# Patient Record
Sex: Female | Born: 1975 | Race: White | Hispanic: No | Marital: Married | Smoking: Current every day smoker
Health system: Southern US, Community
[De-identification: ages and names within clinical notes are randomized; demographics above are authoritative.]

## PROBLEM LIST (undated history)

## (undated) DIAGNOSIS — N200 Calculus of kidney: Secondary | ICD-10-CM

---

## 2001-02-07 ENCOUNTER — Other Ambulatory Visit: Admission: RE | Admit: 2001-02-07 | Discharge: 2001-02-07 | Payer: Self-pay | Admitting: *Deleted

## 2002-02-17 ENCOUNTER — Other Ambulatory Visit: Admission: RE | Admit: 2002-02-17 | Discharge: 2002-02-17 | Payer: Self-pay | Admitting: *Deleted

## 2002-08-02 ENCOUNTER — Inpatient Hospital Stay (HOSPITAL_COMMUNITY): Admission: AD | Admit: 2002-08-02 | Discharge: 2002-08-02 | Payer: Self-pay | Admitting: *Deleted

## 2002-09-05 ENCOUNTER — Inpatient Hospital Stay (HOSPITAL_COMMUNITY): Admission: AD | Admit: 2002-09-05 | Discharge: 2002-09-08 | Payer: Self-pay | Admitting: *Deleted

## 2002-10-13 ENCOUNTER — Other Ambulatory Visit: Admission: RE | Admit: 2002-10-13 | Discharge: 2002-10-13 | Payer: Self-pay | Admitting: *Deleted

## 2004-02-01 ENCOUNTER — Other Ambulatory Visit: Admission: RE | Admit: 2004-02-01 | Discharge: 2004-02-01 | Payer: Self-pay | Admitting: *Deleted

## 2005-01-25 ENCOUNTER — Other Ambulatory Visit: Admission: RE | Admit: 2005-01-25 | Discharge: 2005-01-25 | Payer: Self-pay | Admitting: Obstetrics and Gynecology

## 2005-08-04 ENCOUNTER — Inpatient Hospital Stay (HOSPITAL_COMMUNITY): Admission: AD | Admit: 2005-08-04 | Discharge: 2005-08-06 | Payer: Self-pay | Admitting: Obstetrics and Gynecology

## 2005-09-26 ENCOUNTER — Other Ambulatory Visit: Admission: RE | Admit: 2005-09-26 | Discharge: 2005-09-26 | Payer: Self-pay | Admitting: Obstetrics and Gynecology

## 2016-03-14 DIAGNOSIS — J0100 Acute maxillary sinusitis, unspecified: Secondary | ICD-10-CM | POA: Diagnosis not present

## 2016-05-25 DIAGNOSIS — Z01419 Encounter for gynecological examination (general) (routine) without abnormal findings: Secondary | ICD-10-CM | POA: Diagnosis not present

## 2016-05-25 DIAGNOSIS — Z6821 Body mass index (BMI) 21.0-21.9, adult: Secondary | ICD-10-CM | POA: Diagnosis not present

## 2016-05-25 DIAGNOSIS — N760 Acute vaginitis: Secondary | ICD-10-CM | POA: Diagnosis not present

## 2016-05-25 DIAGNOSIS — N938 Other specified abnormal uterine and vaginal bleeding: Secondary | ICD-10-CM | POA: Diagnosis not present

## 2016-05-25 DIAGNOSIS — Z1212 Encounter for screening for malignant neoplasm of rectum: Secondary | ICD-10-CM | POA: Diagnosis not present

## 2016-05-25 DIAGNOSIS — Z1231 Encounter for screening mammogram for malignant neoplasm of breast: Secondary | ICD-10-CM | POA: Diagnosis not present

## 2016-06-01 ENCOUNTER — Other Ambulatory Visit: Payer: Self-pay | Admitting: Obstetrics and Gynecology

## 2016-06-01 DIAGNOSIS — R928 Other abnormal and inconclusive findings on diagnostic imaging of breast: Secondary | ICD-10-CM

## 2016-06-08 DIAGNOSIS — Z13820 Encounter for screening for osteoporosis: Secondary | ICD-10-CM | POA: Diagnosis not present

## 2016-06-09 ENCOUNTER — Ambulatory Visit
Admission: RE | Admit: 2016-06-09 | Discharge: 2016-06-09 | Disposition: A | Discharge status: Home / Self Care | Payer: BLUE CROSS/BLUE SHIELD | Source: Ambulatory Visit | Attending: Obstetrics and Gynecology | Admitting: Obstetrics and Gynecology

## 2016-06-09 DIAGNOSIS — R922 Inconclusive mammogram: Secondary | ICD-10-CM | POA: Diagnosis not present

## 2016-06-09 DIAGNOSIS — R928 Other abnormal and inconclusive findings on diagnostic imaging of breast: Secondary | ICD-10-CM

## 2016-10-27 DIAGNOSIS — R109 Unspecified abdominal pain: Secondary | ICD-10-CM | POA: Diagnosis not present

## 2016-10-27 DIAGNOSIS — M549 Dorsalgia, unspecified: Secondary | ICD-10-CM | POA: Diagnosis not present

## 2017-06-11 DIAGNOSIS — R8290 Unspecified abnormal findings in urine: Secondary | ICD-10-CM | POA: Diagnosis not present

## 2017-06-11 DIAGNOSIS — R51 Headache: Secondary | ICD-10-CM | POA: Diagnosis not present

## 2017-07-10 DIAGNOSIS — Z01419 Encounter for gynecological examination (general) (routine) without abnormal findings: Secondary | ICD-10-CM | POA: Diagnosis not present

## 2017-07-10 DIAGNOSIS — N760 Acute vaginitis: Secondary | ICD-10-CM | POA: Diagnosis not present

## 2017-07-10 DIAGNOSIS — N952 Postmenopausal atrophic vaginitis: Secondary | ICD-10-CM | POA: Diagnosis not present

## 2017-07-10 DIAGNOSIS — Z6821 Body mass index (BMI) 21.0-21.9, adult: Secondary | ICD-10-CM | POA: Diagnosis not present

## 2017-07-10 DIAGNOSIS — Z1231 Encounter for screening mammogram for malignant neoplasm of breast: Secondary | ICD-10-CM | POA: Diagnosis not present

## 2017-11-07 ENCOUNTER — Emergency Department (HOSPITAL_BASED_OUTPATIENT_CLINIC_OR_DEPARTMENT_OTHER): Payer: BLUE CROSS/BLUE SHIELD

## 2017-11-07 ENCOUNTER — Encounter (HOSPITAL_BASED_OUTPATIENT_CLINIC_OR_DEPARTMENT_OTHER): Payer: Self-pay | Admitting: Emergency Medicine

## 2017-11-07 ENCOUNTER — Emergency Department (HOSPITAL_BASED_OUTPATIENT_CLINIC_OR_DEPARTMENT_OTHER)
Admission: EM | Admit: 2017-11-07 | Discharge: 2017-11-07 | Disposition: A | Discharge status: Home / Self Care | Payer: BLUE CROSS/BLUE SHIELD | Attending: Emergency Medicine | Admitting: Emergency Medicine

## 2017-11-07 DIAGNOSIS — N83201 Unspecified ovarian cyst, right side: Secondary | ICD-10-CM | POA: Diagnosis not present

## 2017-11-07 DIAGNOSIS — R109 Unspecified abdominal pain: Secondary | ICD-10-CM

## 2017-11-07 DIAGNOSIS — Z87442 Personal history of urinary calculi: Secondary | ICD-10-CM | POA: Insufficient documentation

## 2017-11-07 DIAGNOSIS — F17200 Nicotine dependence, unspecified, uncomplicated: Secondary | ICD-10-CM | POA: Insufficient documentation

## 2017-11-07 DIAGNOSIS — N23 Unspecified renal colic: Secondary | ICD-10-CM | POA: Diagnosis not present

## 2017-11-07 DIAGNOSIS — R102 Pelvic and perineal pain: Secondary | ICD-10-CM

## 2017-11-07 DIAGNOSIS — R319 Hematuria, unspecified: Secondary | ICD-10-CM

## 2017-11-07 DIAGNOSIS — N83202 Unspecified ovarian cyst, left side: Secondary | ICD-10-CM | POA: Diagnosis not present

## 2017-11-07 HISTORY — DX: Calculus of kidney: N20.0

## 2017-11-07 LAB — URINALYSIS, MICROSCOPIC (REFLEX)

## 2017-11-07 LAB — URINALYSIS, ROUTINE W REFLEX MICROSCOPIC
Bilirubin Urine: NEGATIVE
GLUCOSE, UA: NEGATIVE mg/dL
Ketones, ur: NEGATIVE mg/dL
Leukocytes, UA: NEGATIVE
NITRITE: NEGATIVE
PH: 7 (ref 5.0–8.0)
Protein, ur: NEGATIVE mg/dL
SPECIFIC GRAVITY, URINE: 1.01 (ref 1.005–1.030)

## 2017-11-07 LAB — PREGNANCY, URINE: Preg Test, Ur: NEGATIVE

## 2017-11-07 MED ORDER — HYDROCODONE-ACETAMINOPHEN 5-325 MG PO TABS: 1.0000 | ORAL_TABLET | Freq: Four times a day (QID) | ORAL | 0 refills | Status: AC | PRN

## 2017-11-07 MED ORDER — IBUPROFEN 600 MG PO TABS: 600.0000 mg | ORAL_TABLET | Freq: Four times a day (QID) | ORAL | 0 refills | Status: AC | PRN

## 2017-11-07 NOTE — ED Notes (Signed)
ED Provider at bedside. 

## 2017-11-07 NOTE — Discharge Instructions (Signed)
Take ibuprofen for pain. Take Norco for severe pain. Do not drive if taking this medication. Follow up with GYN doctor. Your Ultrasound shows 3x4cm cyst on the right ovary. Return to ER if worsening symptoms.

## 2017-11-07 NOTE — ED Notes (Signed)
US called

## 2017-11-07 NOTE — ED Triage Notes (Addendum)
[facility] 2 hours. Denies urinary symptoms. Denies N/V. [facility]

## 2017-11-07 NOTE — ED Provider Notes (Signed)
[facility] EMERGENCY DEPARTMENT Provider Note   CSN: [id number] Arrival date & time: 11/07/17  1808     History   Chief Complaint Chief Complaint  Patient presents with  . Flank Pain    HPI Isabel Fisher is a 41 y.o. female.  HPI Isabel Fisher is a 41 y.o. female with history of kidney stones, presents to emergency department complaining of sudden onset of right flank pain.  States pain started approximately 3 hours ago.  Reports associated nausea.  No vomiting.  Patient was able to get into her doctor's office where received a shot of Toradol and was sent here for further evaluation.  Patient states her pain improved.  She now reports just a dull pain in the right flank that radiates into the right lower abdomen.  She states this pain is very similar to prior kidney stone.  Denies ever having any procedures or lithotripsy done for her stones.  States she has had 2 kidney stones in the past.  Patient also reports history of kidney infection.  Denies any fever or chills.  No vaginal symptoms.  Normal bowel movements.  Past Medical History:  Diagnosis Date  . Kidney stones     There are no active problems to display for this patient.   History reviewed. No pertinent surgical history.  OB History    No data available       Home Medications    Prior to Admission medications   Not on File    Family History No family history on file.  Social History Social History   Tobacco Use  . Smoking status: Current Every Day Smoker  . Smokeless tobacco: Never Used  Substance Use Topics  . Alcohol use: No    Frequency: Never  . Drug use: No     Allergies   Patient has no known allergies.   Review of Systems Review of Systems  Constitutional: Negative for chills and fever.  Respiratory: Negative for cough, chest tightness and shortness of breath.   Cardiovascular: Negative for chest pain, palpitations and leg swelling.  Gastrointestinal: Positive  for abdominal pain and nausea. Negative for diarrhea and vomiting.  Genitourinary: Positive for flank pain. Negative for dysuria, pelvic pain, vaginal bleeding, vaginal discharge and vaginal pain.  Musculoskeletal: Negative for arthralgias, myalgias, neck pain and neck stiffness.  Skin: Negative for rash.  Neurological: Negative for dizziness, weakness and headaches.  All other systems reviewed and are negative.    Physical Exam Updated Vital Signs BP 121/84   Pulse 72   Temp 98.1 F (36.7 C) (Oral)   Resp 16   Ht 5\' 2"  (1.575 m)   Wt 56.2 kg (124 lb)   LMP 10/29/2017   SpO2 100%   BMI 22.68 kg/m   Physical Exam  Constitutional: She is oriented to person, place, and time. She appears well-developed and well-nourished. No distress.  HENT:  Head: Normocephalic.  Eyes: Conjunctivae are normal.  Neck: Neck supple.  Cardiovascular: Normal rate, regular rhythm and normal heart sounds.  Pulmonary/Chest: Effort normal and breath sounds normal. No respiratory distress. She has no wheezes. She has no rales.  Abdominal: Soft. Bowel sounds are normal. She exhibits no distension. There is tenderness. There is no rebound and no guarding.  Right upper quadrant and lower quadrant tenderness. No CVA tenderness bilaterally  Musculoskeletal: She exhibits no edema.  Neurological: She is alert and oriented to person, place, and time.  Skin: Skin is warm and dry.  Psychiatric:  She has a normal mood and affect. Her behavior is normal.  Nursing note and vitals reviewed.    ED Treatments / Results  Labs (all labs ordered are listed, but only abnormal results are displayed) Labs Reviewed  URINALYSIS, ROUTINE W REFLEX MICROSCOPIC - Abnormal; Notable for the following components:      Result Value   Hgb urine dipstick LARGE (*)    All other components within normal limits  URINALYSIS, MICROSCOPIC (REFLEX) - Abnormal; Notable for the following components:   Bacteria, UA FEW (*)    Squamous  Epithelial / LPF 0-5 (*)    All other components within normal limits  PREGNANCY, URINE    EKG  EKG Interpretation None       Radiology [place] Pelvic Doppler (torsion R/o Or Mass Arterial Flow)  Result Date: 11/07/2017 CLINICAL DATA:  Right lower quadrant abdominal pain. 3.2 cm right-sided ovarian cyst. EXAM: TRANSABDOMINAL AND TRANSVAGINAL ULTRASOUND OF PELVIS DOPPLER ULTRASOUND OF OVARIES TECHNIQUE: Both transabdominal and transvaginal ultrasound examinations of the pelvis were performed. Transabdominal technique was performed for global imaging of the pelvis including uterus, ovaries, adnexal regions, and pelvic cul-de-sac. It was necessary to proceed with endovaginal exam following the transabdominal exam to visualize the ovaries. Color and duplex Doppler ultrasound was utilized to evaluate blood flow to the ovaries. COMPARISON:  CT abdomen pelvis from the same day. FINDINGS: Uterus Measurements: 7.1 x 3.3 x 4.0 cm, within normal limits. No fibroids or other mass visualized. Endometrium Thickness: 8.5 mm, within normal limits. No focal abnormality visualized. Right ovary Measurements: 4.2 x 2.6 x 3.5 cm, within normal limits. A cystic lesion has somewhat indistinct borders measuring 3.4 x 2.2 x 3.0 cm. Left ovary Measurements: 2.7 x 1.8 x 1.9 cm. Normal appearance/no adnexal mass. Pulsed Doppler evaluation of both ovaries demonstrates normal low-resistance arterial and venous waveforms. Other findings No abnormal free fluid. IMPRESSION: 1. Slightly ill-defined 3.4 cm cyst of the right ovary may represent a hemorrhagic cyst. There is no significant solid component. 2. Normal pulsed upper evaluation both ovaries demonstrating low resistance arterial and venous waveforms and flow. 3. Otherwise unremarkable left ovary. Electronically Signed   By: Marin Robertshristopher  Mattern M.D.   On: 11/07/2017 21:15   Ct Renal Stone Study  Result Date: 11/07/2017 CLINICAL DATA:  Right lower quadrant abdominal pain and  nausea today. Personal history of renal stones. EXAM: CT ABDOMEN AND PELVIS WITHOUT CONTRAST TECHNIQUE: Multidetector CT imaging of the abdomen and pelvis was performed following the standard protocol without IV contrast. COMPARISON:  None. FINDINGS: Lower chest: The lung bases are clear without focal nodule, mass, or airspace disease. The heart size is normal. No significant pleural or pericardial effusion is present. Hepatobiliary: No focal liver abnormality is seen. No gallstones, gallbladder wall thickening, or biliary dilatation. Pancreas: Unremarkable. No pancreatic ductal dilatation or surrounding inflammatory changes. Spleen: Normal in size without focal abnormality. Adrenals/Urinary Tract: The adrenal glands are normal bilaterally. At least 6 punctate nonobstructing stones are present in the right kidney. A least 4 nonobstructing stones are present in the left kidney. The largest is in the midportion of the kidney measuring 4 cm. There is no hydronephrosis. The ureters are within normal limits bilaterally. Stomach/Bowel: The stomach and duodenum are within normal limits. Small bowel is unremarkable. The terminal ileum is within normal limits. The appendix is visualized and normal. Vascular/Lymphatic: No significant vascular findings are present. No enlarged abdominal or pelvic lymph nodes. Reproductive: Uterus and left adnexae are within normal limits. A 3.2 cm  cyst is associated with the right ovary. Adnexa are otherwise within normal limits. Other: No abdominal wall hernia or abnormality. No abdominopelvic ascites. Musculoskeletal: Vertebral body heights and alignment are normal. Bony pelvis is intact. The hips are located and within normal limits. IMPRESSION: 1. Multiple punctate nonobstructing stones in both kidneys. 2. No obstructing uropathy. 3. 3.2 cm cyst associated with the right ovary. This may be related the patient's pain. Pelvic ultrasound could be used for further evaluation if clinically  indicated. 4. Normal CT appearance of the appendix. Electronically Signed   By: Marin Roberts M.D.   On: 11/07/2017 19:39   US Pelvic Complete With Transvaginal  Result Date: 11/07/2017 CLINICAL DATA:  Right lower quadrant abdominal pain. 3.2 cm right-sided ovarian cyst. EXAM: TRANSABDOMINAL AND TRANSVAGINAL ULTRASOUND OF PELVIS DOPPLER ULTRASOUND OF OVARIES TECHNIQUE: Both transabdominal and transvaginal ultrasound examinations of the pelvis were performed. Transabdominal technique was performed for global imaging of the pelvis including uterus, ovaries, adnexal regions, and pelvic cul-de-sac. It was necessary to proceed with endovaginal exam following the transabdominal exam to visualize the ovaries. Color and duplex Doppler ultrasound was utilized to evaluate blood flow to the ovaries. COMPARISON:  CT abdomen pelvis from the same day. FINDINGS: Uterus Measurements: 7.1 x 3.3 x 4.0 cm, within normal limits. No fibroids or other mass visualized. Endometrium Thickness: 8.5 mm, within normal limits. No focal abnormality visualized. Right ovary Measurements: 4.2 x 2.6 x 3.5 cm, within normal limits. A cystic lesion has somewhat indistinct borders measuring 3.4 x 2.2 x 3.0 cm. Left ovary Measurements: 2.7 x 1.8 x 1.9 cm. Normal appearance/no adnexal mass. Pulsed Doppler evaluation of both ovaries demonstrates normal low-resistance arterial and venous waveforms. Other findings No abnormal free fluid. IMPRESSION: 1. Slightly ill-defined 3.4 cm cyst of the right ovary may represent a hemorrhagic cyst. There is no significant solid component. 2. Normal pulsed upper evaluation both ovaries demonstrating low resistance arterial and venous waveforms and flow. 3. Otherwise unremarkable left ovary. Electronically Signed   By: Marin Roberts M.D.   On: 11/07/2017 21:15    Procedures Procedures (including critical care time)  Medications Ordered in ED Medications - No data to display   Initial  Impression / Assessment and Plan / ED Course  I have reviewed the triage vital signs and the nursing notes.  Pertinent labs & imaging results that were available during my care of the patient were reviewed by me and considered in my medical decision making (see chart for details).     7:02 PM Patient with history of kidney stones, here with sudden onset of right flank pain.  It feels similar to prior stones.  Received 60 mg of Toradol PCPs office, sent here for imaging.  We discussed option of not performing any imaging tests and having patient follow-up if pain does not improve in several days.  Patient however feels uncomfortable with that plan and requested imaging to see "have a the stone is."  Will get CT renal for further evaluation. Pt is currently comfortable. Nad.   8:08 PM Pt continues to be comfortable, NAD. CT with no obstructing stones. Possible explanation could be passed stone, especially in setting that the pt is appearing very comfortable at this time. CT also showed right ovarian cyst, will get [place] for further evaluation and to RO torsion.  9:39 PM Patient's ultrasound showing 3.4 cm cyst in the right ovary.  Patient continues to be comfortable.  Plan to discharge home, will provide some pain medications in case  the pain returns.  Otherwise we will have her follow-up with OB/GYN.  She will call them tomorrow.  Return precautions discussed.  Vitals:   11/07/17 1821 11/07/17 1822 11/07/17 2117  BP:  121/84 123/82  Pulse: 72  63  Resp: 16  18  Temp: 98.1 F (36.7 C)    TempSrc: Oral    SpO2: 100%  99%  Weight:  56.2 kg (124 lb)   Height:  5\' 2"  (1.575 m)      Final Clinical Impressions(s) / ED Diagnoses   Final diagnoses:  Adnexal pain  Cyst of right ovary  Flank pain  Hematuria, unspecified type    ED Discharge Orders        Ordered    HYDROcodone-acetaminophen (NORCO) 5-325 MG tablet  Every 6 hours PRN     11/07/17 2135    ibuprofen (ADVIL,MOTRIN) 600 MG  tablet  Every 6 hours PRN     11/07/17 2135       Jaynie CrumbleKirichenko, Eloise Mula, PA-C 11/07/17 2140    Rolland PorterJames, Mark, MD 11/14/17 1526

## 2017-11-13 DIAGNOSIS — N83201 Unspecified ovarian cyst, right side: Secondary | ICD-10-CM | POA: Diagnosis not present

## 2017-12-07 DIAGNOSIS — N83299 Other ovarian cyst, unspecified side: Secondary | ICD-10-CM | POA: Diagnosis not present

## 2018-06-24 IMAGING — US US ART/VEN ABD/PELV/SCROTUM DOPPLER LTD
1 series · 13 of 25 positions shown · non-contrast
Comparison: CT abdomen pelvis from the same day.

CLINICAL DATA: Right lower quadrant abdominal pain. 3.2 cm
right-sided ovarian cyst.

EXAM:
TRANSABDOMINAL AND TRANSVAGINAL ULTRASOUND OF PELVIS
DOPPLER ULTRASOUND OF OVARIES
TECHNIQUE: Both transabdominal and transvaginal ultrasound examinations of the
pelvis were performed. Transabdominal technique was performed for
global imaging of the pelvis including uterus, ovaries, adnexal
regions, and pelvic cul-de-sac.
It was necessary to proceed with endovaginal exam following the
transabdominal exam to visualize the ovaries. Color and duplex
Doppler ultrasound was utilized to evaluate blood flow to the
ovaries.

[Series 1: us art/ven abd/pelv/scrotum doppler ltd · 0.17mm/px · 13 of 47 slices shown]
[im 1/47]
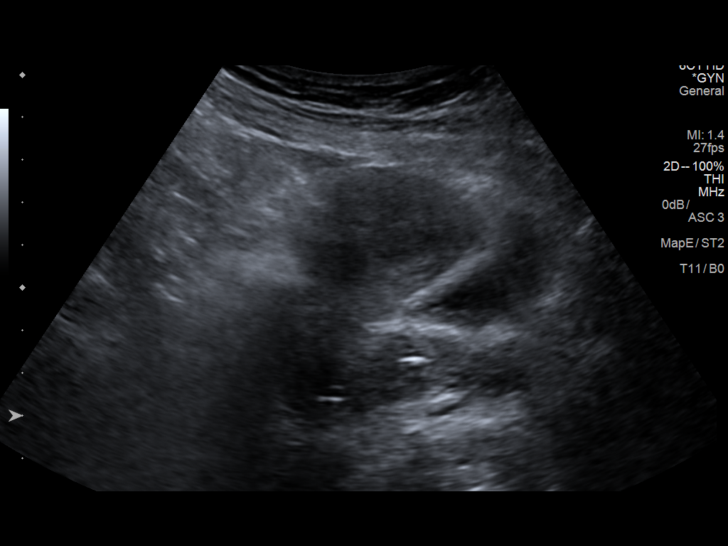
[im 4/47]
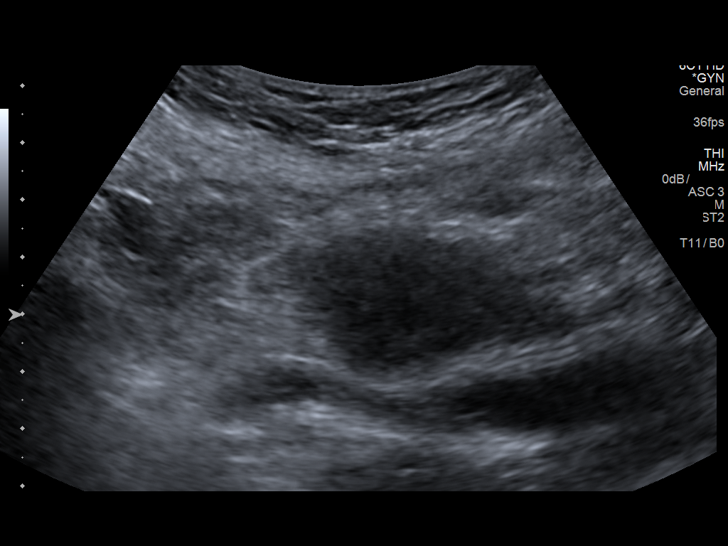
[im 8/47]
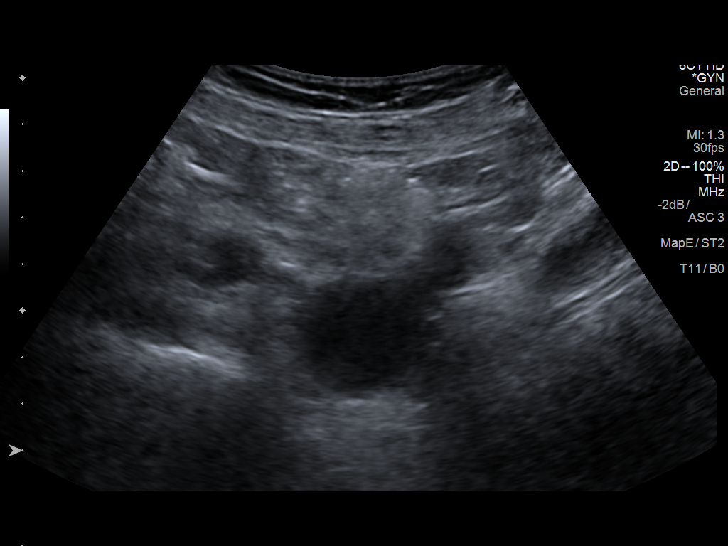
[im 12/47]
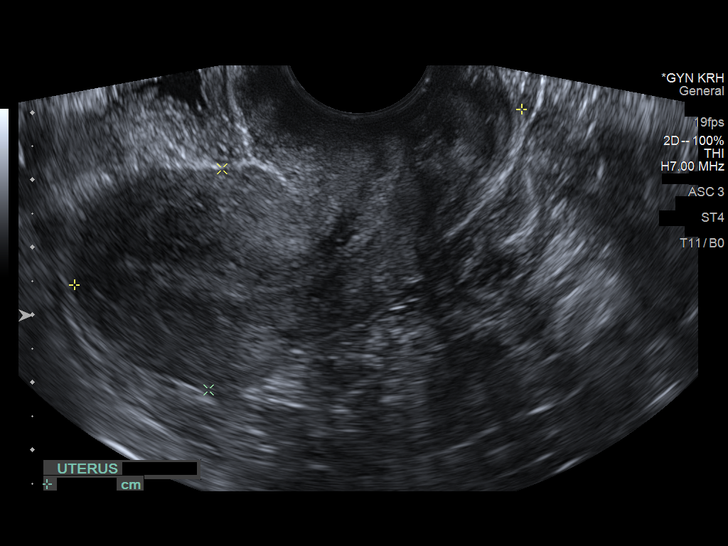
[im 16/47]
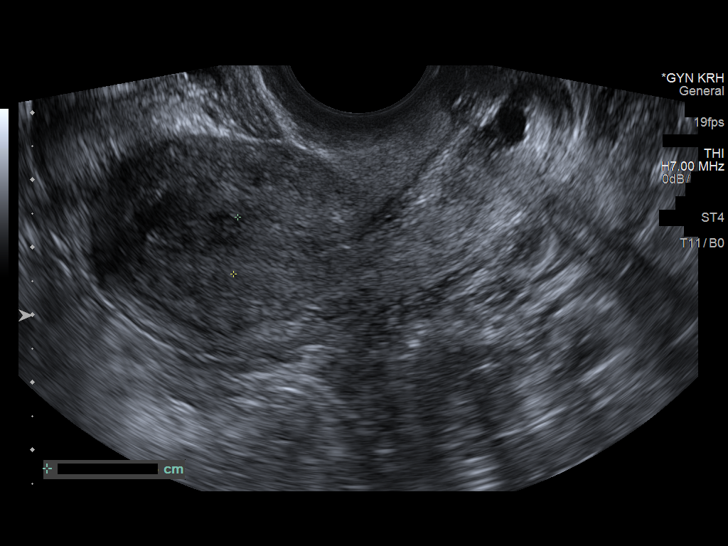
[im 20/47]
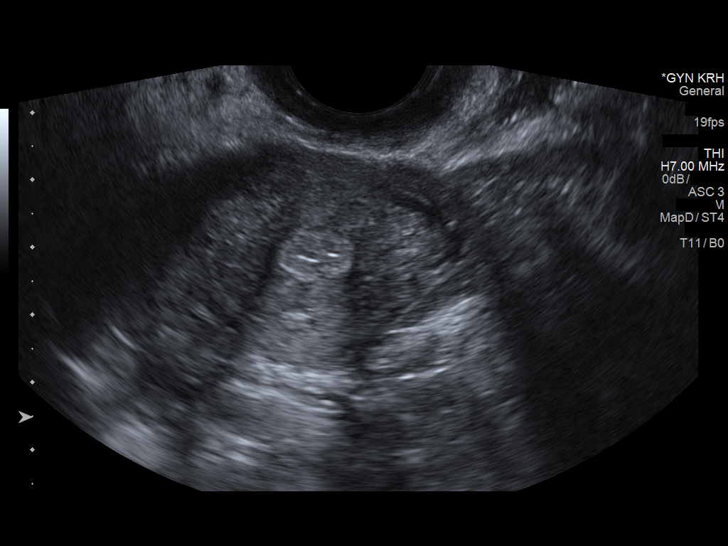
[im 24/47]
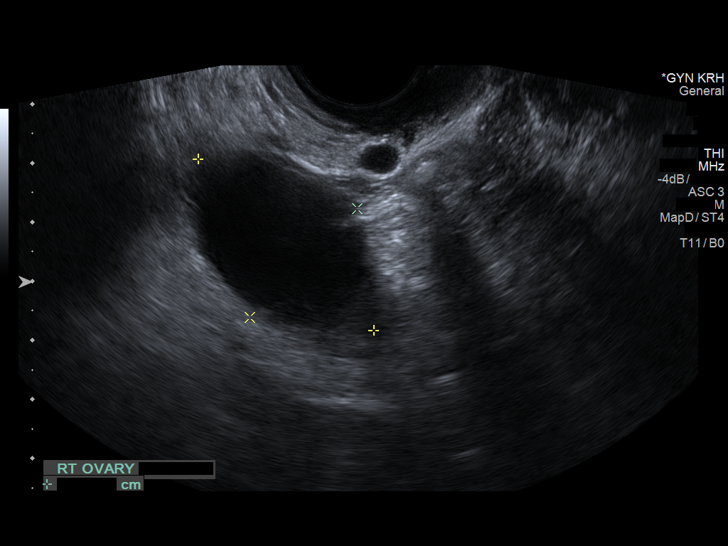
[im 27/47]
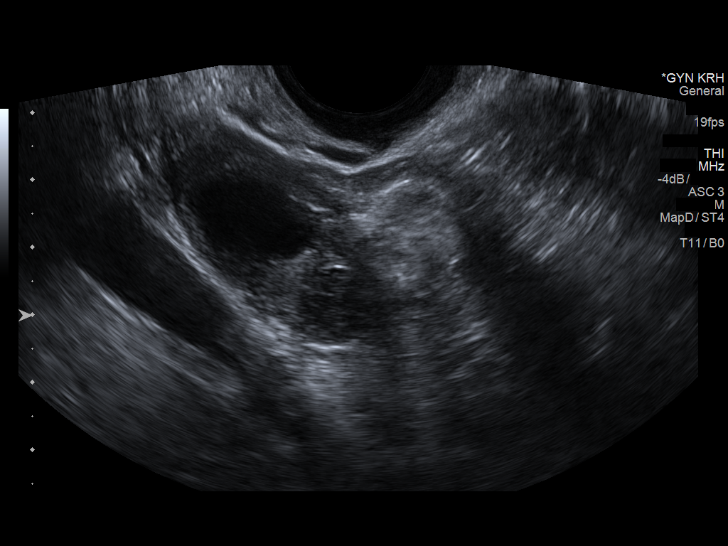
[im 31/47]
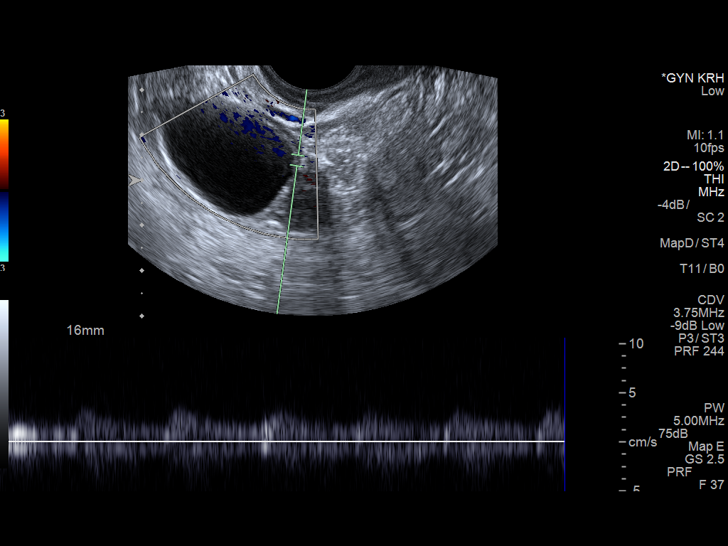
[im 35/47]
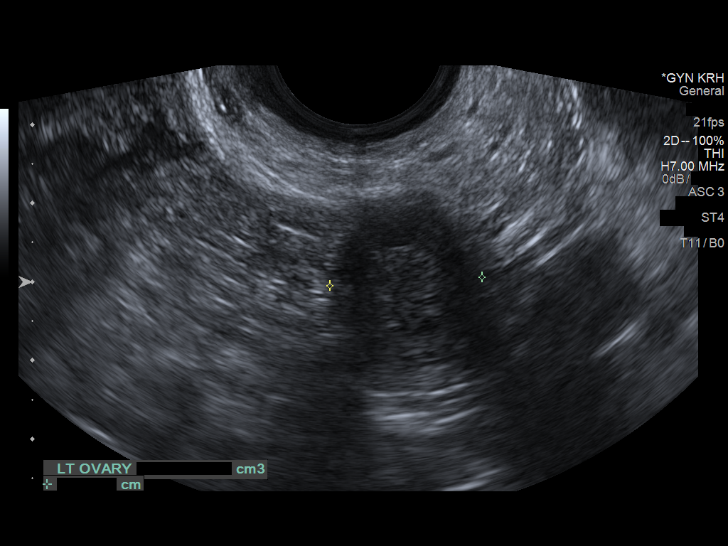
[im 39/47]
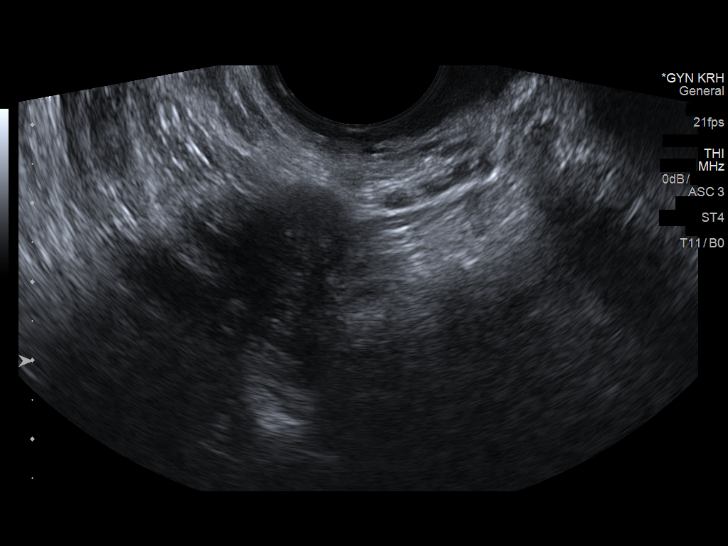
[im 43/47]
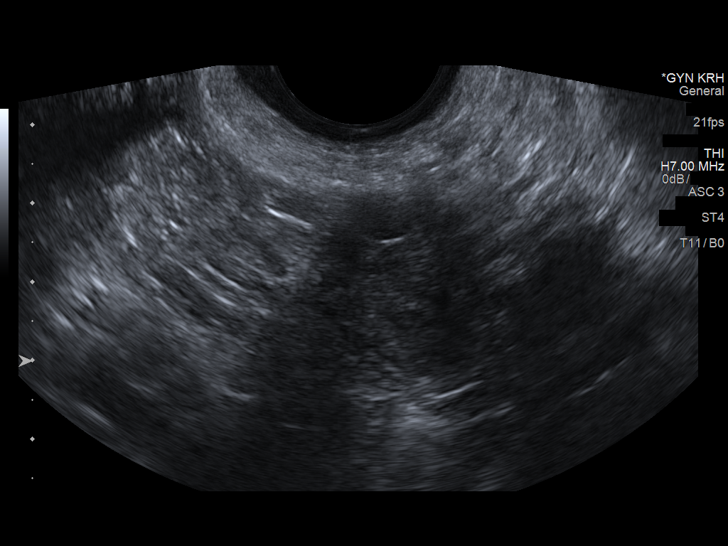
[im 47/47]
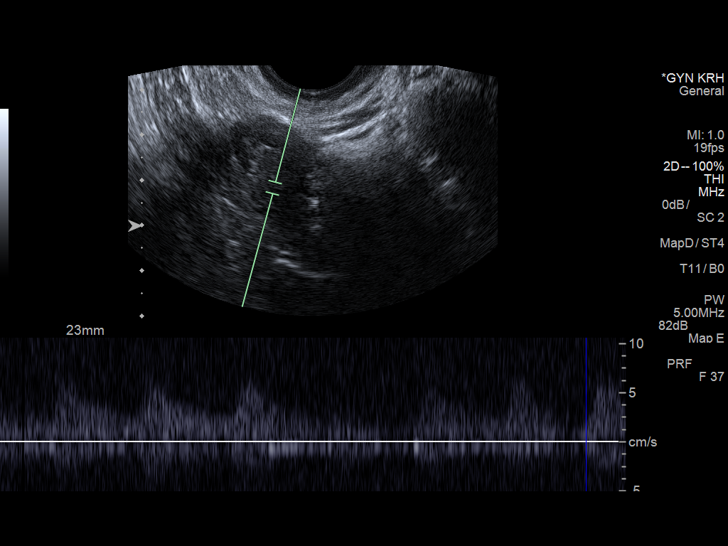

[13 of 25 positions shown; findings below may reference images not displayed]

FINDINGS: Uterus

Measurements: 7.1 x 3.3 x 4.0 cm, within normal limits. No fibroids
or other mass visualized.

Endometrium

Thickness: 8.5 mm, within normal limits. No focal abnormality
visualized.

Right ovary

Measurements: 4.2 x 2.6 x 3.5 cm, within normal limits. A cystic
lesion has somewhat indistinct borders measuring 3.4 x 2.2 x 3.0 cm.

Left ovary

Measurements: 2.7 x 1.8 x 1.9 cm. Normal appearance/no adnexal mass.

Pulsed Doppler evaluation of both ovaries demonstrates normal
low-resistance arterial and venous waveforms.

Other findings

No abnormal free fluid.
IMPRESSION: 1. Slightly ill-defined 3.4 cm cyst of the right ovary may represent
a hemorrhagic cyst. There is no significant solid component.
2. Normal pulsed upper evaluation both ovaries demonstrating low
resistance arterial and venous waveforms and flow.
3. Otherwise unremarkable left ovary.

## 2018-08-14 ENCOUNTER — Other Ambulatory Visit: Payer: Self-pay | Admitting: Obstetrics and Gynecology

## 2018-08-14 DIAGNOSIS — R928 Other abnormal and inconclusive findings on diagnostic imaging of breast: Secondary | ICD-10-CM

## 2018-08-20 ENCOUNTER — Ambulatory Visit
Admission: RE | Admit: 2018-08-20 | Discharge: 2018-08-20 | Disposition: A | Discharge status: Home / Self Care | Payer: Managed Care, Other (non HMO) | Source: Ambulatory Visit | Attending: Obstetrics and Gynecology | Admitting: Obstetrics and Gynecology

## 2018-08-20 ENCOUNTER — Ambulatory Visit
Admission: RE | Admit: 2018-08-20 | Discharge: 2018-08-20 | Disposition: A | Discharge status: Home / Self Care | Payer: BLUE CROSS/BLUE SHIELD | Source: Ambulatory Visit | Attending: Obstetrics and Gynecology | Admitting: Obstetrics and Gynecology

## 2018-08-20 DIAGNOSIS — R928 Other abnormal and inconclusive findings on diagnostic imaging of breast: Secondary | ICD-10-CM

## 2020-03-23 DIAGNOSIS — T7840XA Allergy, unspecified, initial encounter: Secondary | ICD-10-CM | POA: Diagnosis not present

## 2020-05-28 IMAGING — MG DIGITAL DIAGNOSTIC UNILATERAL LEFT MAMMOGRAM WITH TOMO AND CAD
4 series · 4 of 12 positions shown · non-contrast
Comparison: Previous exam(s).

CLINICAL DATA: 42-year-old female recalled from screening mammogram
dated 08/13/2018 for a possible left breast mass.

EXAM:
DIGITAL DIAGNOSTIC LEFT MAMMOGRAM WITH CAD AND TOMO
ULTRASOUND LEFT BREAST

[L MLO synth-2D]
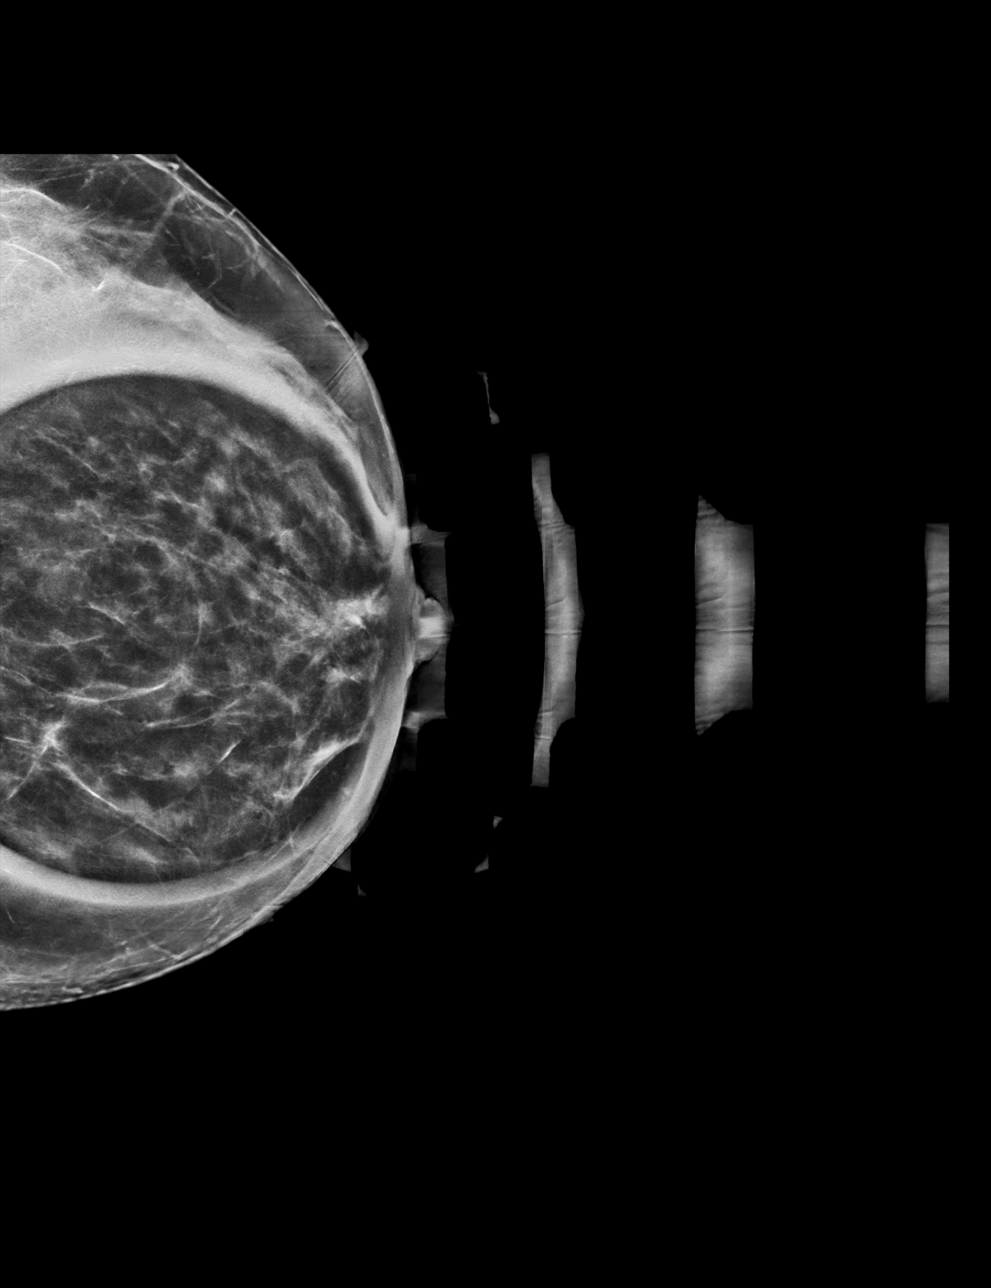

[L CC synth-2D]
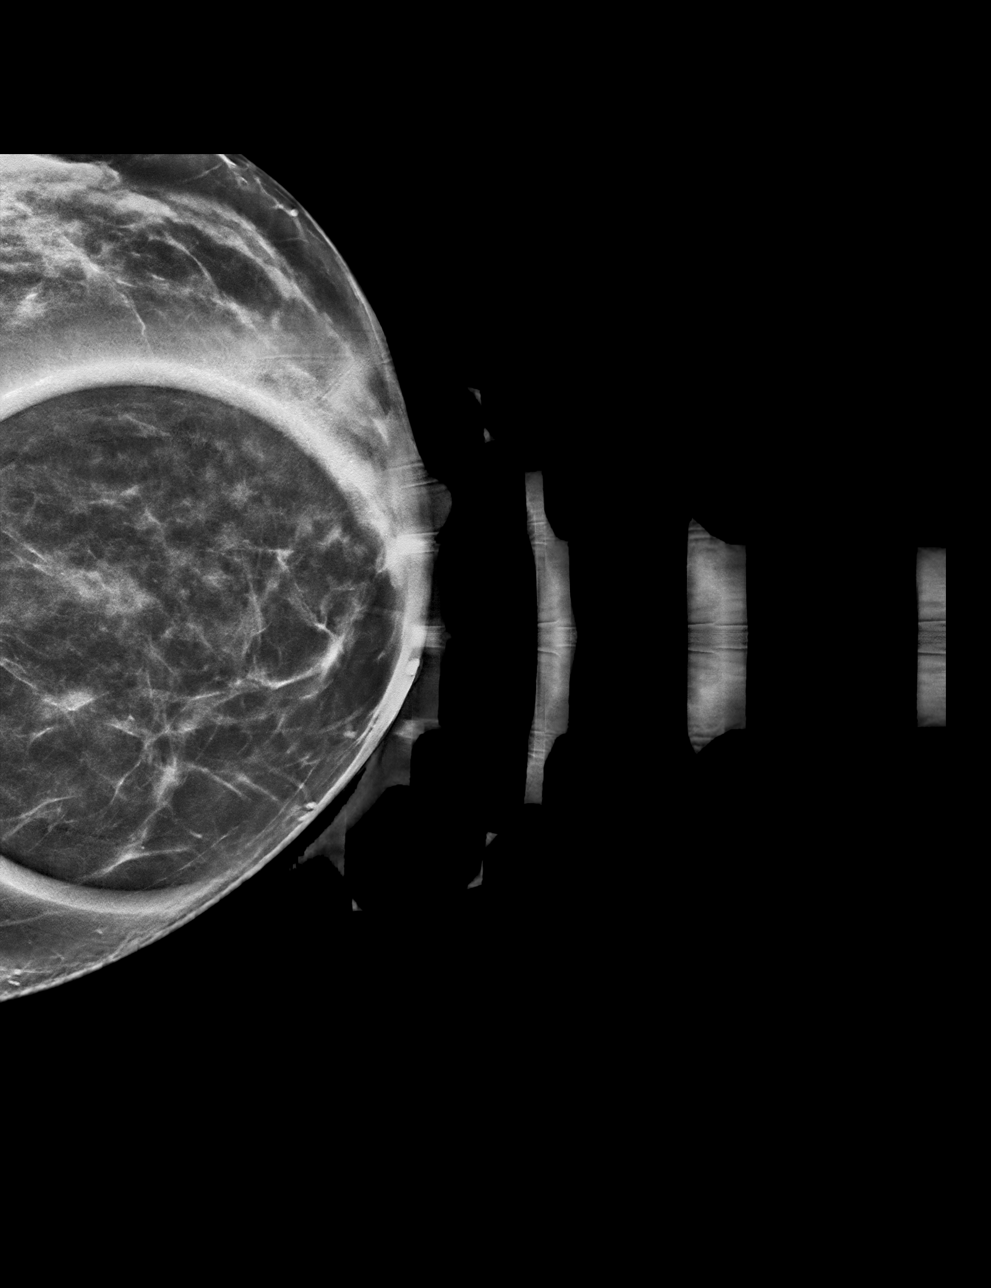

[L MLO tomo · tomo slice 33/64.0]
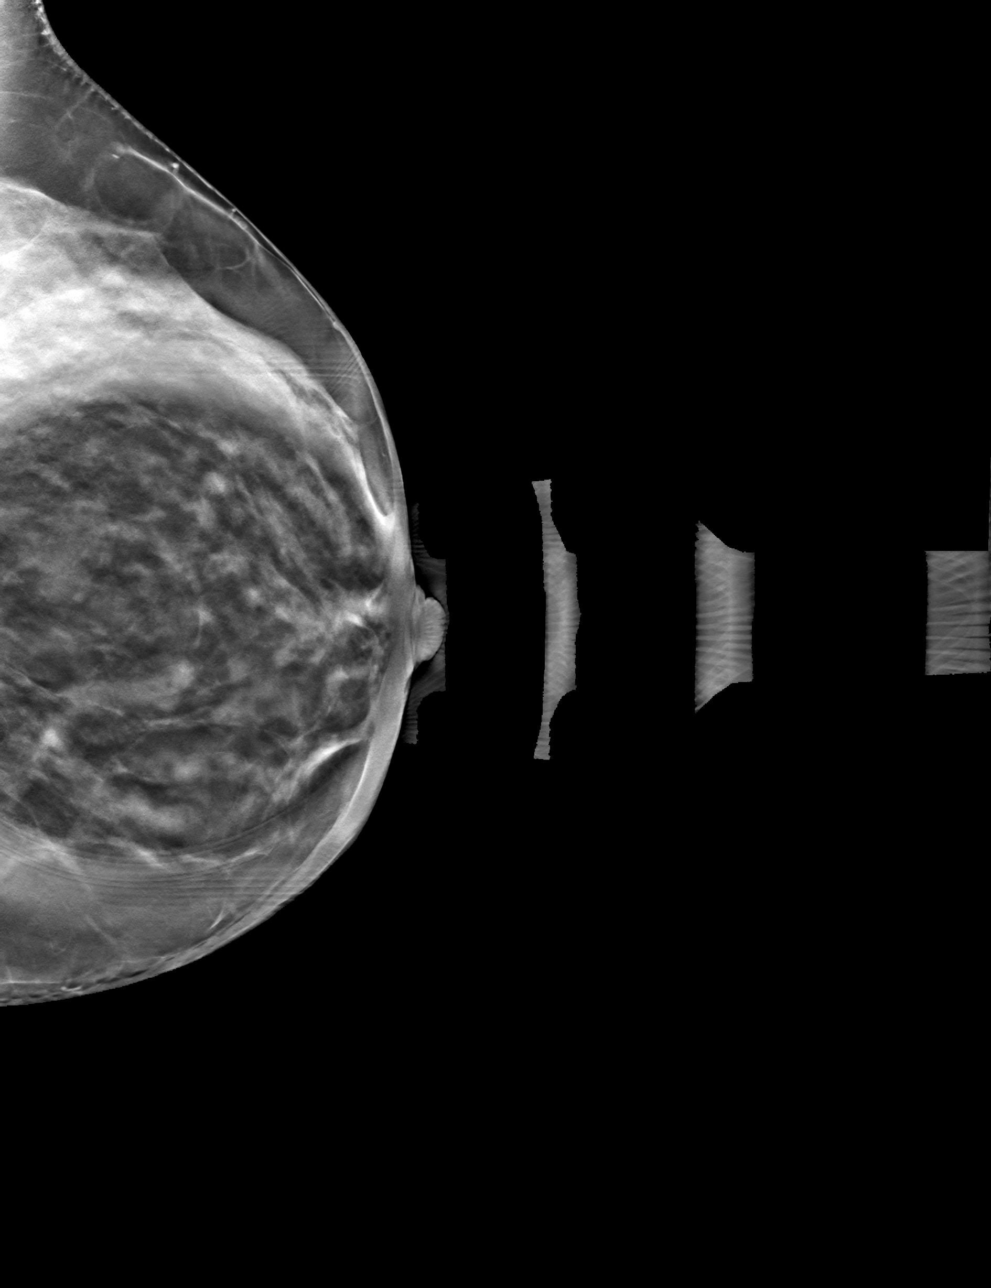

[L CC tomo · tomo slice 31/61.0]
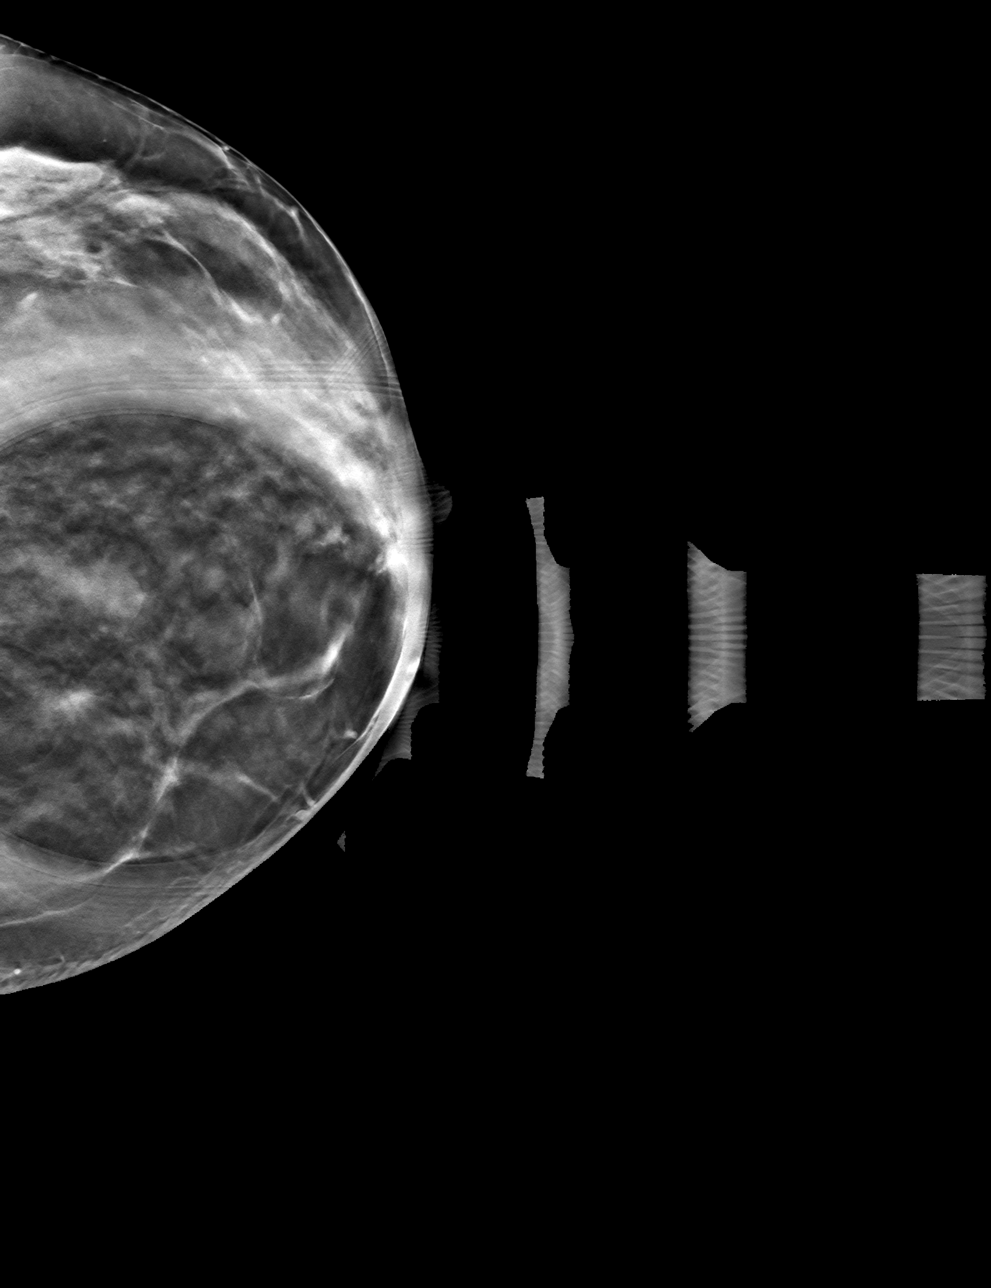

[4 of 12 positions shown; findings below may reference images not displayed]

ACR Breast Density Category c: The breast tissue is heterogeneously
dense, which may obscure small masses.
FINDINGS: A persistent mass in the lower inner quadrant of the left breast at
middle depth demonstrates circumscribed borders and is equal in
density. Additional oval, circumscribed masses are scattered
throughout the breast in a waxing and waning pattern.

Mammographic images were processed with CAD.

Targeted ultrasound is performed, showing an oval, circumscribed
anechoic mass at the 9 o'clock position 2 cm from the nipple. It
measures 1 x 0.8 x 0.5 cm. There is no internal vascularity. This
correlates well with the mammographic finding and is consistent with
a benign simple cyst. Additional simple and minimally complicated
cysts are scattered throughout the left breast in real-time
scanning.
IMPRESSION: Benign simple cyst corresponding with the screening mammographic
findings. No further imaging follow-up required.

RECOMMENDATION:
Screening mammogram in one year.(Code:Z5-B-6NT)

I have discussed the findings and recommendations with the patient.
Results were also provided in writing at the conclusion of the
visit. If applicable, a reminder letter will be sent to the patient
regarding the next appointment.

BI-RADS CATEGORY  2: Benign.

## 2020-05-28 IMAGING — US ULTRASOUND LEFT BREAST LIMITED
1 series · 11 of 11 positions shown · non-contrast
Comparison: Previous exam(s).

CLINICAL DATA: 42-year-old female recalled from screening mammogram
dated 08/13/2018 for a possible left breast mass.

EXAM:
DIGITAL DIAGNOSTIC LEFT MAMMOGRAM WITH CAD AND TOMO
ULTRASOUND LEFT BREAST

[Series 1: ultrasound left breast limited · 0.06mm/px · 11 of 11 slices shown]
[im 1/11]
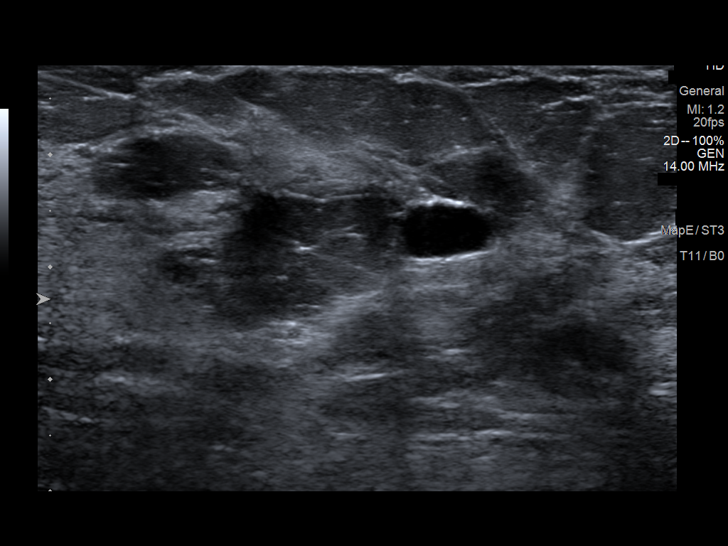
[im 2/11]
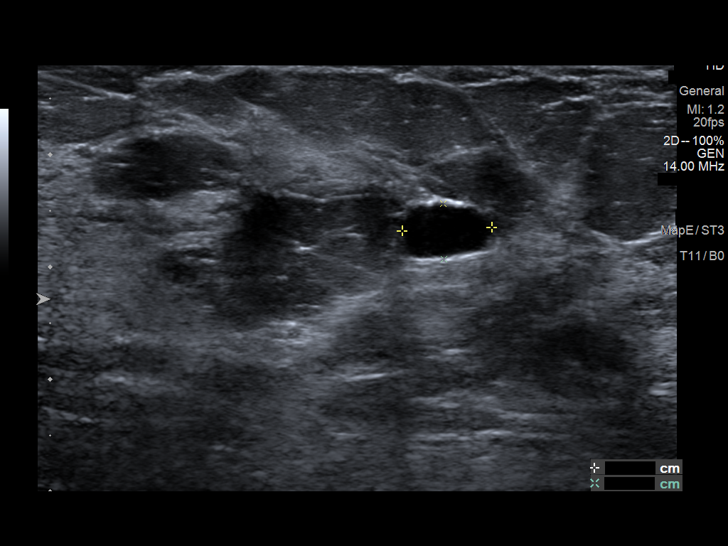
[im 3/11]
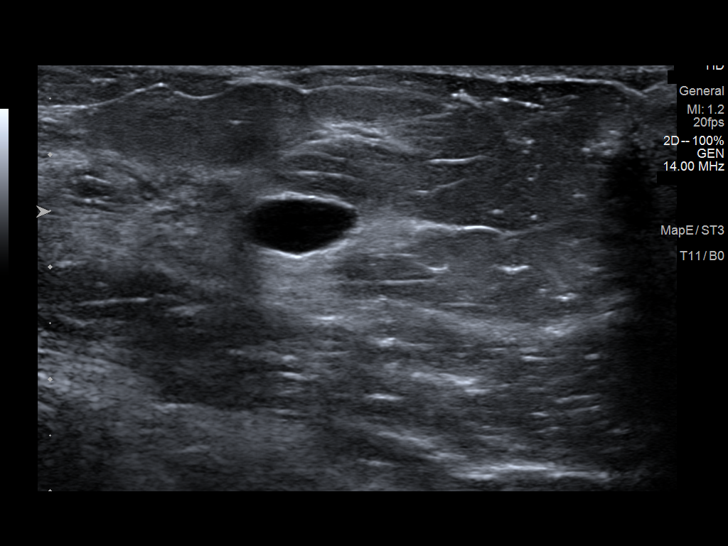
[im 4/11]
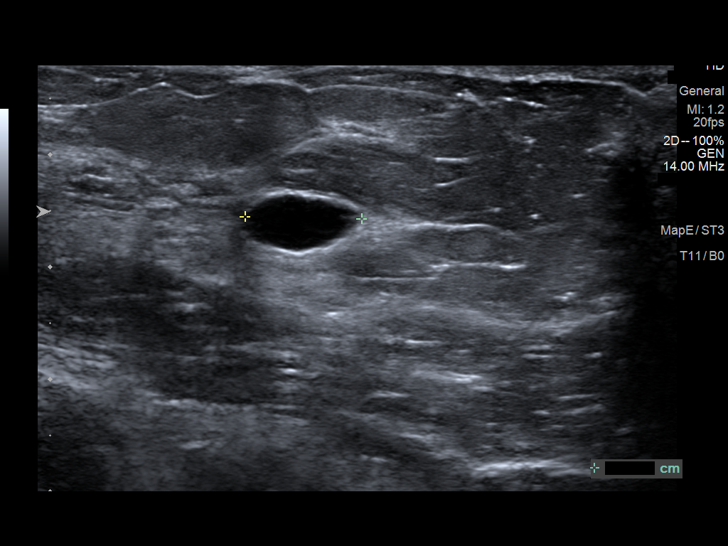
[im 5/11]
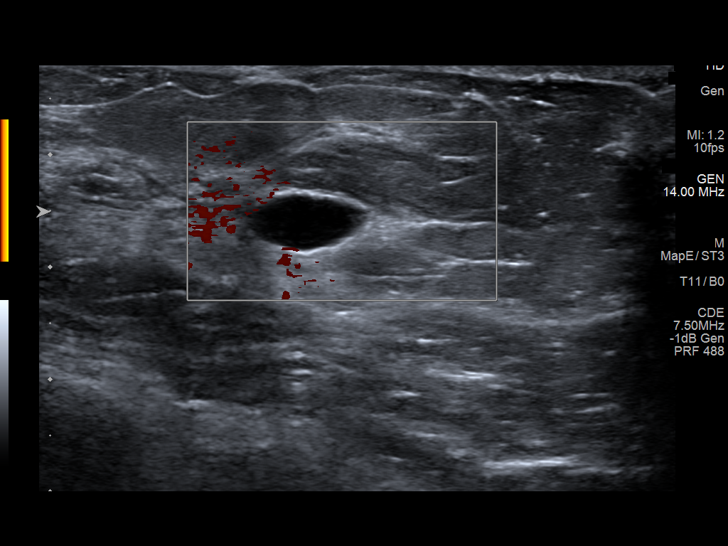
[im 6/11]
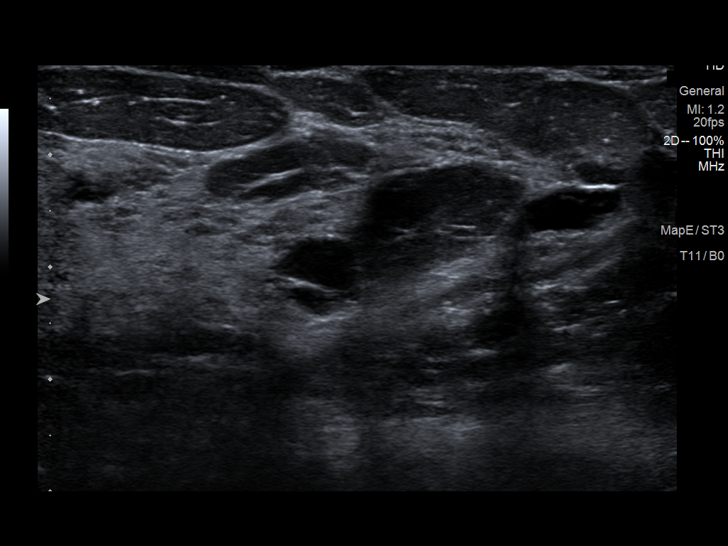
[im 7/11]
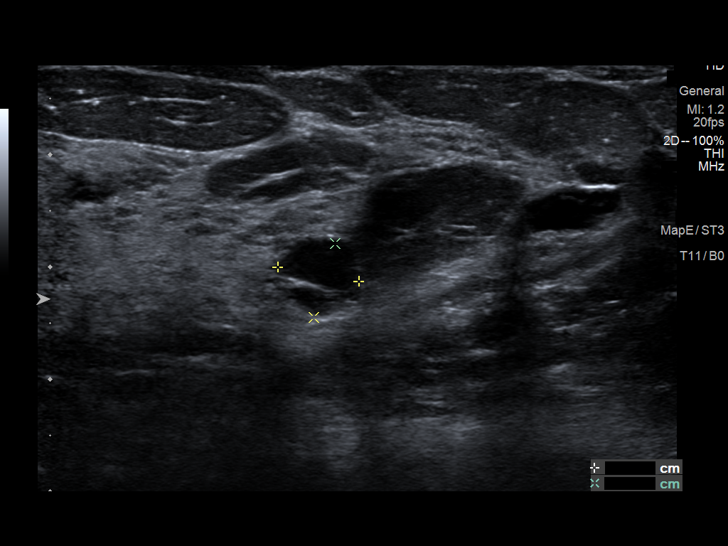
[im 8/11]
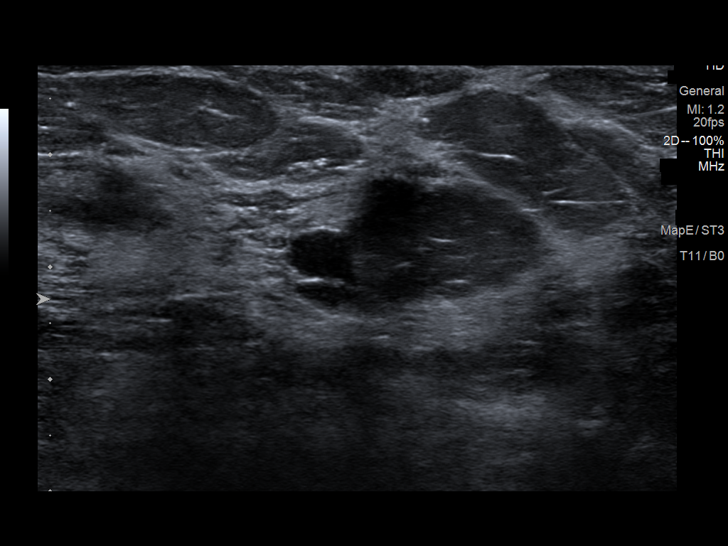
[im 9/11]
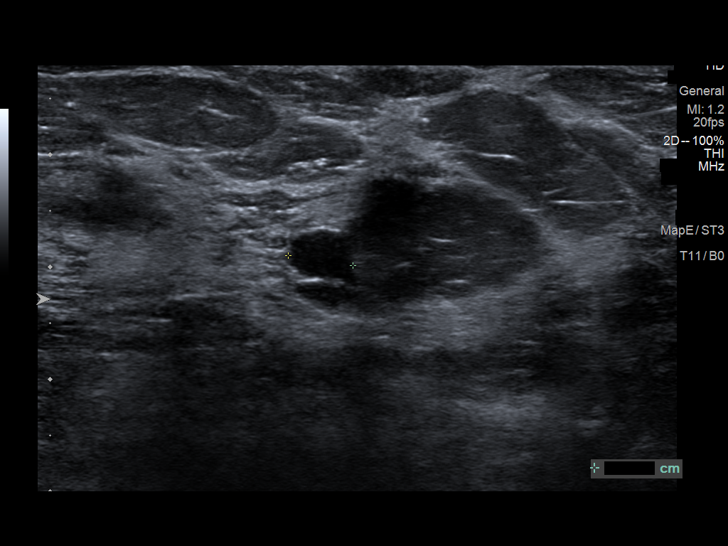
[im 10/11]
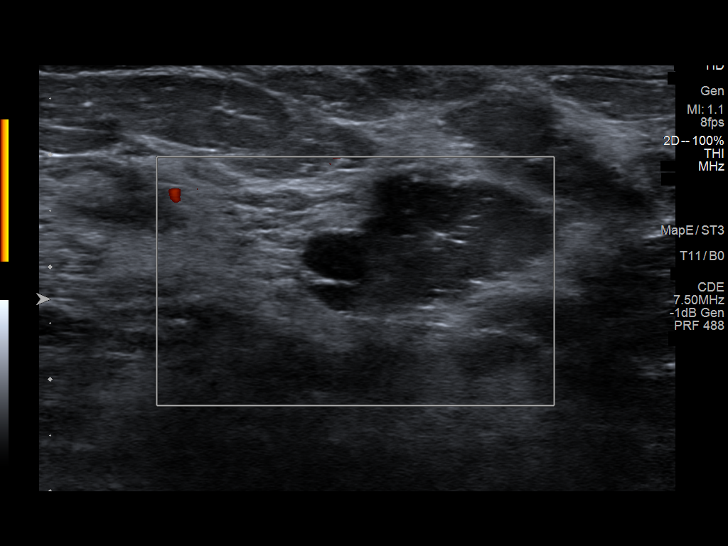
[im 11/11]
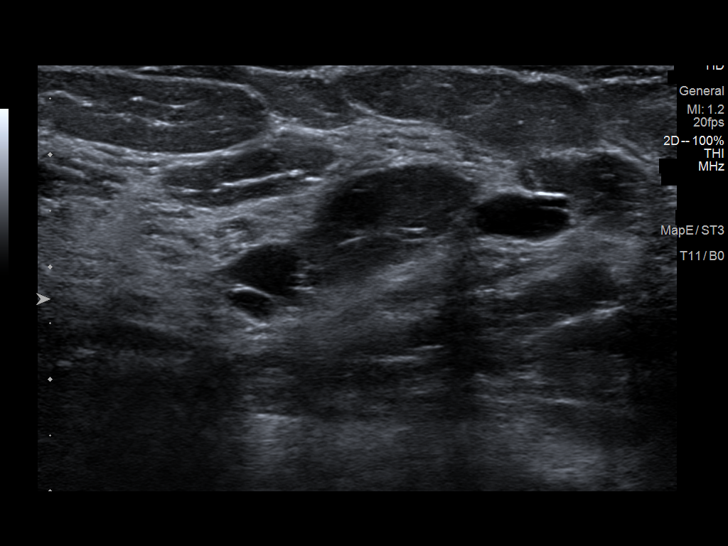

[11 of 11 positions shown; findings below may reference images not displayed]

ACR Breast Density Category c: The breast tissue is heterogeneously
dense, which may obscure small masses.
FINDINGS: A persistent mass in the lower inner quadrant of the left breast at
middle depth demonstrates circumscribed borders and is equal in
density. Additional oval, circumscribed masses are scattered
throughout the breast in a waxing and waning pattern.

Mammographic images were processed with CAD.

Targeted ultrasound is performed, showing an oval, circumscribed
anechoic mass at the 9 o'clock position 2 cm from the nipple. It
measures 1 x 0.8 x 0.5 cm. There is no internal vascularity. This
correlates well with the mammographic finding and is consistent with
a benign simple cyst. Additional simple and minimally complicated
cysts are scattered throughout the left breast in real-time
scanning.
IMPRESSION: Benign simple cyst corresponding with the screening mammographic
findings. No further imaging follow-up required.

RECOMMENDATION:
Screening mammogram in one year.(Code:Z5-B-6NT)

I have discussed the findings and recommendations with the patient.
Results were also provided in writing at the conclusion of the
visit. If applicable, a reminder letter will be sent to the patient
regarding the next appointment.

BI-RADS CATEGORY  2: Benign.

## 2020-11-08 DIAGNOSIS — Z01419 Encounter for gynecological examination (general) (routine) without abnormal findings: Secondary | ICD-10-CM | POA: Diagnosis not present

## 2020-11-08 DIAGNOSIS — Z1231 Encounter for screening mammogram for malignant neoplasm of breast: Secondary | ICD-10-CM | POA: Diagnosis not present

## 2020-11-08 DIAGNOSIS — Z6825 Body mass index (BMI) 25.0-25.9, adult: Secondary | ICD-10-CM | POA: Diagnosis not present

## 2020-11-08 DIAGNOSIS — Z13820 Encounter for screening for osteoporosis: Secondary | ICD-10-CM | POA: Diagnosis not present

## 2021-02-21 DIAGNOSIS — M79674 Pain in right toe(s): Secondary | ICD-10-CM | POA: Diagnosis not present
